# Patient Record
Sex: Female | Born: 1965 | Race: Black or African American | Hispanic: No | Marital: Single
Health system: Southern US, Community
[De-identification: ages and names within clinical notes are randomized; demographics above are authoritative.]

---

## 2005-07-21 ENCOUNTER — Ambulatory Visit: Payer: Self-pay | Admitting: Family Medicine

## 2005-09-08 ENCOUNTER — Ambulatory Visit: Payer: Self-pay | Admitting: Urology

## 2007-12-16 ENCOUNTER — Ambulatory Visit: Payer: Self-pay | Admitting: Family Medicine

## 2007-12-25 ENCOUNTER — Ambulatory Visit: Payer: Self-pay | Admitting: General Surgery

## 2007-12-25 ENCOUNTER — Other Ambulatory Visit: Payer: Self-pay

## 2008-01-16 ENCOUNTER — Ambulatory Visit: Payer: Self-pay | Admitting: General Surgery

## 2008-01-24 ENCOUNTER — Inpatient Hospital Stay: Payer: Self-pay | Admitting: Vascular Surgery

## 2008-08-19 ENCOUNTER — Ambulatory Visit: Payer: Self-pay | Admitting: Family Medicine

## 2008-09-03 ENCOUNTER — Ambulatory Visit: Payer: Self-pay | Admitting: Family Medicine

## 2009-04-27 ENCOUNTER — Ambulatory Visit: Payer: Self-pay | Admitting: Family Medicine

## 2010-01-12 ENCOUNTER — Ambulatory Visit: Payer: Self-pay | Admitting: Family Medicine

## 2011-05-15 ENCOUNTER — Ambulatory Visit: Payer: Self-pay | Admitting: Family Medicine

## 2012-06-18 ENCOUNTER — Ambulatory Visit: Payer: Self-pay | Admitting: Family Medicine

## 2012-07-02 ENCOUNTER — Ambulatory Visit: Payer: Self-pay | Admitting: Family Medicine

## 2013-01-16 ENCOUNTER — Ambulatory Visit: Payer: Self-pay | Admitting: Family Medicine

## 2014-06-29 ENCOUNTER — Ambulatory Visit: Payer: Self-pay | Admitting: Family Medicine

## 2016-04-17 ENCOUNTER — Ambulatory Visit
Admission: EM | Admit: 2016-04-17 | Discharge: 2016-04-17 | Disposition: A | Discharge status: Home / Self Care | Payer: 59 | Source: Ambulatory Visit | Attending: Family Medicine | Admitting: Family Medicine

## 2016-04-17 ENCOUNTER — Ambulatory Visit
Admission: RE | Admit: 2016-04-17 | Discharge: 2016-04-17 | Disposition: A | Discharge status: Home / Self Care | Payer: 59 | Source: Ambulatory Visit | Attending: Family Medicine | Admitting: Family Medicine

## 2016-04-17 ENCOUNTER — Other Ambulatory Visit (HOSPITAL_COMMUNITY): Payer: Self-pay | Admitting: Family Medicine

## 2016-04-17 DIAGNOSIS — M25561 Pain in right knee: Secondary | ICD-10-CM | POA: Diagnosis present

## 2016-04-17 DIAGNOSIS — M818 Other osteoporosis without current pathological fracture: Secondary | ICD-10-CM | POA: Diagnosis not present

## 2016-04-17 DIAGNOSIS — R52 Pain, unspecified: Secondary | ICD-10-CM

## 2016-04-17 DIAGNOSIS — M8580 Other specified disorders of bone density and structure, unspecified site: Secondary | ICD-10-CM | POA: Insufficient documentation

## 2016-06-09 ENCOUNTER — Other Ambulatory Visit: Payer: Self-pay | Admitting: Family Medicine

## 2016-06-09 DIAGNOSIS — Z1231 Encounter for screening mammogram for malignant neoplasm of breast: Secondary | ICD-10-CM

## 2016-07-03 ENCOUNTER — Ambulatory Visit: Payer: 59

## 2016-07-20 ENCOUNTER — Ambulatory Visit
Admission: RE | Admit: 2016-07-20 | Discharge: 2016-07-20 | Disposition: A | Discharge status: Home / Self Care | Payer: 59 | Source: Ambulatory Visit | Attending: Family Medicine | Admitting: Family Medicine

## 2016-07-20 DIAGNOSIS — Z1231 Encounter for screening mammogram for malignant neoplasm of breast: Secondary | ICD-10-CM | POA: Diagnosis present

## 2017-03-19 ENCOUNTER — Ambulatory Visit
Admission: RE | Admit: 2017-03-19 | Discharge: 2017-03-19 | Disposition: A | Discharge status: Home / Self Care | Payer: 59 | Source: Ambulatory Visit | Attending: Family Medicine | Admitting: Family Medicine

## 2017-03-19 ENCOUNTER — Other Ambulatory Visit: Payer: Self-pay | Admitting: Family Medicine

## 2017-03-19 DIAGNOSIS — M67432 Ganglion, left wrist: Secondary | ICD-10-CM | POA: Insufficient documentation

## 2017-09-03 ENCOUNTER — Other Ambulatory Visit: Payer: Self-pay | Admitting: Family Medicine

## 2017-09-03 DIAGNOSIS — Z1231 Encounter for screening mammogram for malignant neoplasm of breast: Secondary | ICD-10-CM

## 2017-09-27 ENCOUNTER — Ambulatory Visit
Admission: RE | Admit: 2017-09-27 | Discharge: 2017-09-27 | Disposition: A | Discharge status: Home / Self Care | Payer: 59 | Source: Ambulatory Visit | Attending: Family Medicine | Admitting: Family Medicine

## 2017-09-27 DIAGNOSIS — Z1231 Encounter for screening mammogram for malignant neoplasm of breast: Secondary | ICD-10-CM | POA: Diagnosis present

## 2019-06-17 ENCOUNTER — Telehealth: Payer: Self-pay

## 2019-06-17 ENCOUNTER — Other Ambulatory Visit: Payer: Self-pay

## 2019-06-17 NOTE — Telephone Encounter (Signed)
Gastroenterology Pre-Procedure Review  Request Date: PENDING CALL BACK Requesting Physician: Dr. PENDING  PATIENT REVIEW QUESTIONS: The patient responded to the following health history questions as indicated:    1. Are you having any GI issues? no 2. Do you have a personal history of Polyps? no 3. Do you have a family history of Colon Cancer or Polyps? no 4. Diabetes Mellitus? no 5. Joint replacements in the past 12 months?no 6. Major health problems in the past 3 months?no 7. Any artificial heart valves, MVP, or defibrillator?no    MEDICATIONS & ALLERGIES:    Patient reports the following regarding taking any anticoagulation/antiplatelet therapy:   Plavix, Coumadin, Eliquis, Xarelto, Lovenox, Pradaxa, Brilinta, or Effient? no Aspirin? no  Patient confirms/reports the following medications:  No current outpatient medications on file.   No current facility-administered medications for this visit.     Patient confirms/reports the following allergies:  Not on File  No orders of the defined types were placed in this encounter.   AUTHORIZATION INFORMATION Primary Insurance: 1D#: Group #:  Secondary Insurance: 1D#: Group #:  SCHEDULE INFORMATION: Date: PENDING PT Call Back Time: Location:[facility]

## 2019-06-25 ENCOUNTER — Encounter: Payer: Self-pay | Admitting: *Deleted

## 2020-11-02 ENCOUNTER — Other Ambulatory Visit: Payer: Self-pay | Admitting: Family Medicine

## 2020-11-02 DIAGNOSIS — Z1231 Encounter for screening mammogram for malignant neoplasm of breast: Secondary | ICD-10-CM

## 2020-11-23 ENCOUNTER — Ambulatory Visit
Admission: RE | Admit: 2020-11-23 | Discharge: 2020-11-23 | Disposition: A | Discharge status: Home / Self Care | Payer: No Typology Code available for payment source | Source: Ambulatory Visit | Attending: Family Medicine | Admitting: Family Medicine

## 2020-11-23 ENCOUNTER — Other Ambulatory Visit: Payer: Self-pay

## 2020-11-23 DIAGNOSIS — Z1231 Encounter for screening mammogram for malignant neoplasm of breast: Secondary | ICD-10-CM | POA: Insufficient documentation

## 2022-01-10 ENCOUNTER — Other Ambulatory Visit: Payer: Self-pay | Admitting: Family Medicine

## 2022-01-10 DIAGNOSIS — Z1231 Encounter for screening mammogram for malignant neoplasm of breast: Secondary | ICD-10-CM

## 2022-03-07 ENCOUNTER — Ambulatory Visit
Admission: RE | Admit: 2022-03-07 | Discharge: 2022-03-07 | Disposition: A | Discharge status: Home / Self Care | Payer: No Typology Code available for payment source | Source: Ambulatory Visit | Attending: Family Medicine | Admitting: Family Medicine

## 2022-03-07 DIAGNOSIS — Z1231 Encounter for screening mammogram for malignant neoplasm of breast: Secondary | ICD-10-CM | POA: Diagnosis present

## 2022-10-14 IMAGING — MG MM DIGITAL SCREENING BILAT W/ TOMO AND CAD
6 of 10 series · 6 of 30 positions shown · non-contrast
Comparison: Previous exam(s).

CLINICAL DATA: Screening.

EXAM:
DIGITAL SCREENING BILATERAL MAMMOGRAM WITH TOMOSYNTHESIS AND CAD
TECHNIQUE: Bilateral screening digital craniocaudal and mediolateral oblique
mammograms were obtained. Bilateral screening digital breast
tomosynthesis was performed. The images were evaluated with
computer-aided detection.

[L MLO synth-2D]
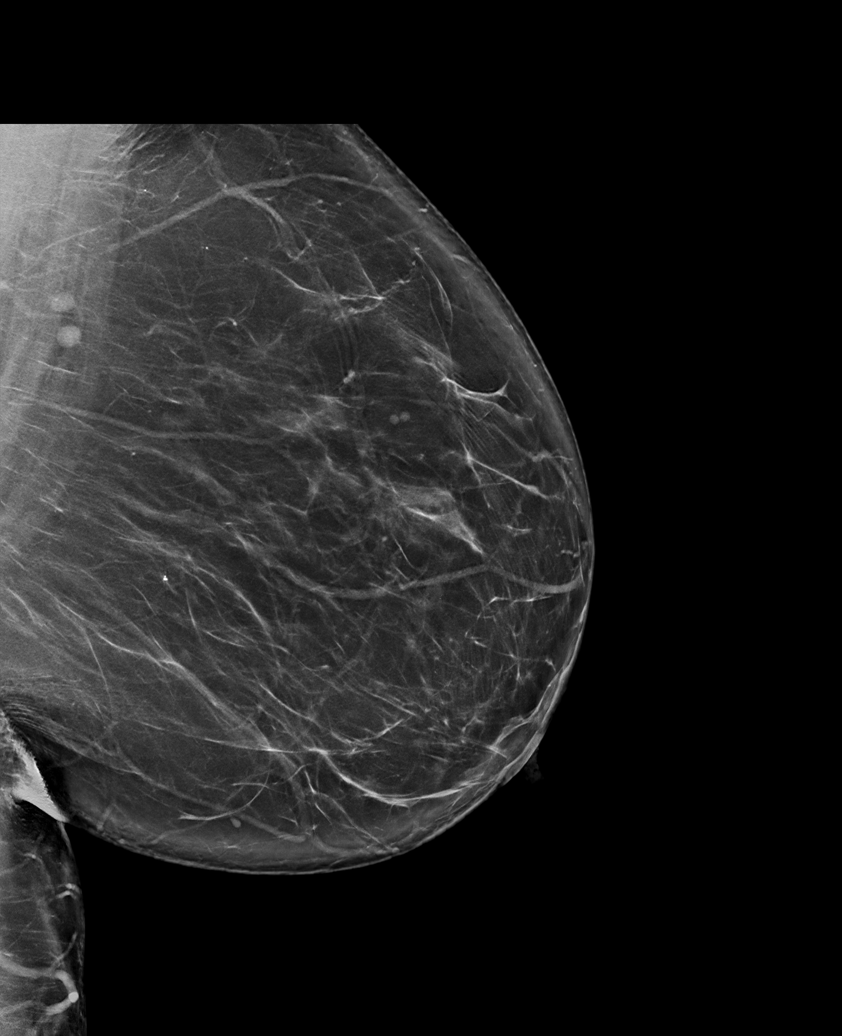

[R CC synth-2D]
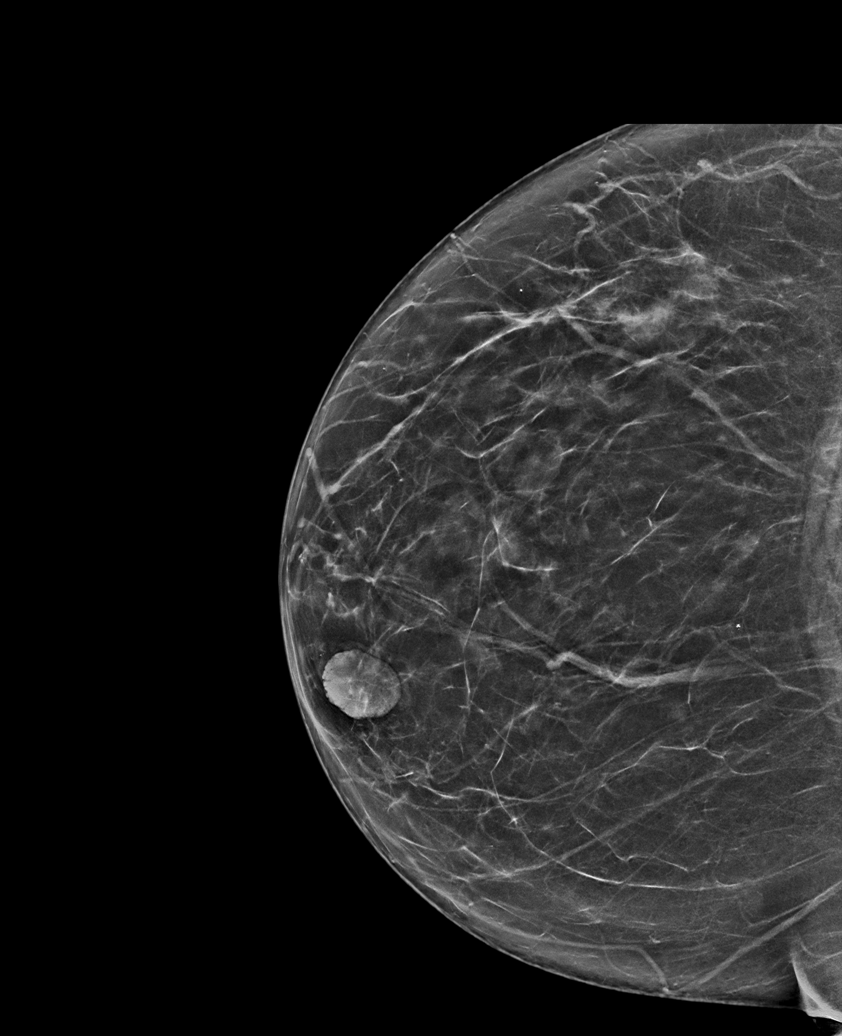

[L CC synth-2D]
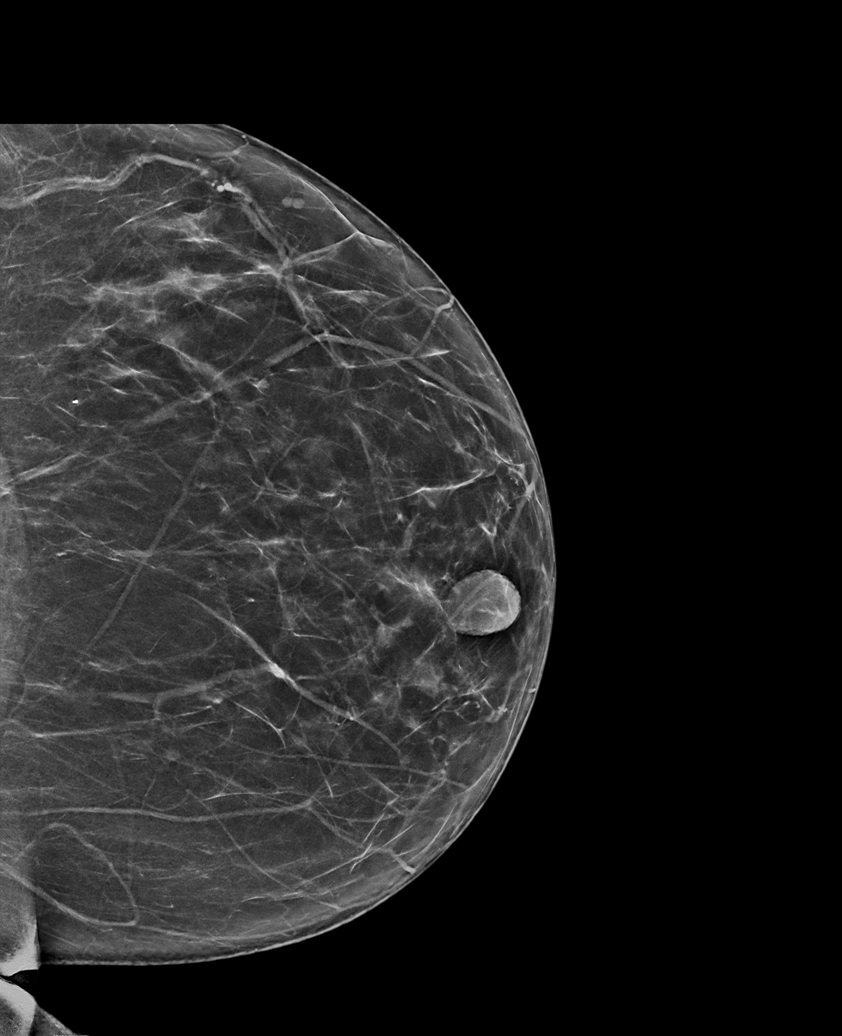

[R MLO synth-2D (1 of 2)]
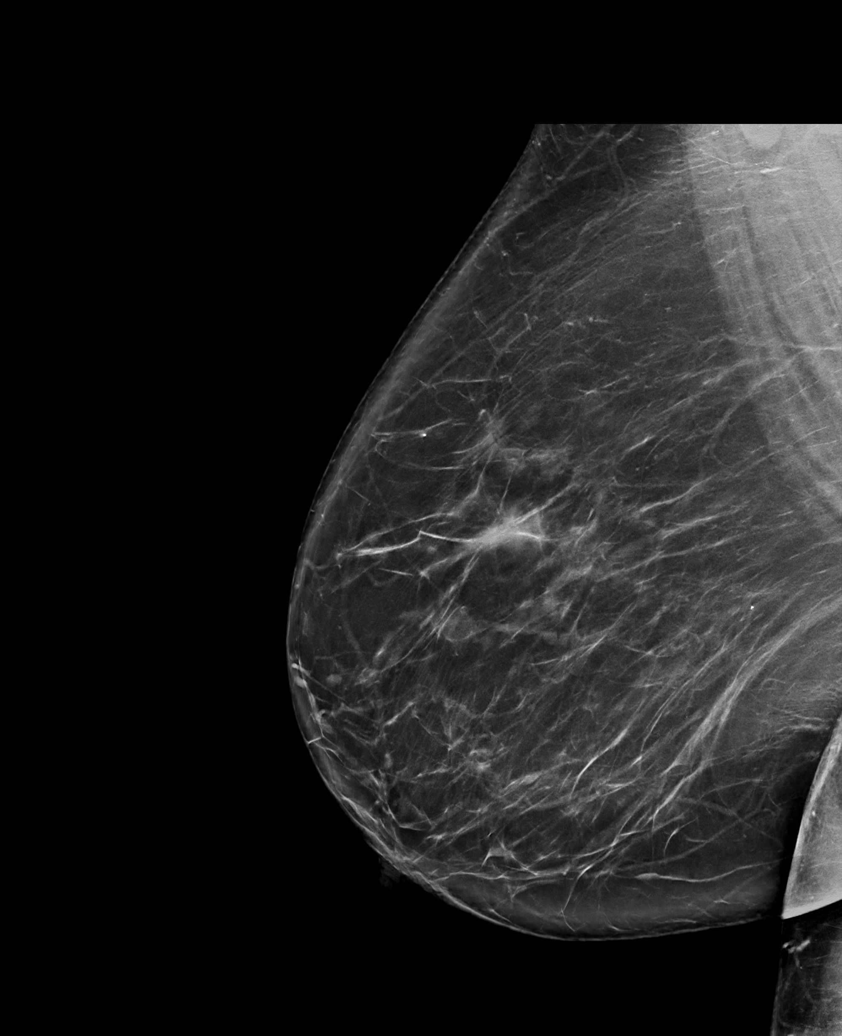

[R MLO synth-2D (2 of 2)]
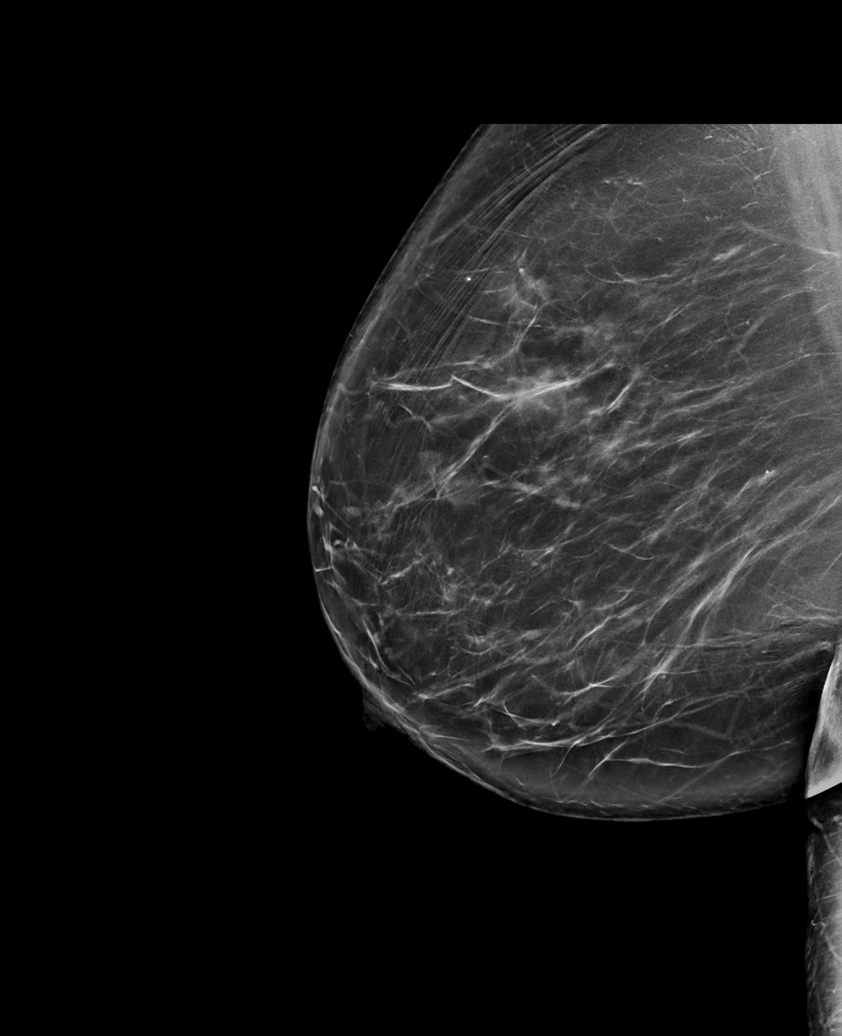

[R MLO tomo · tomo slice 48/95.0]
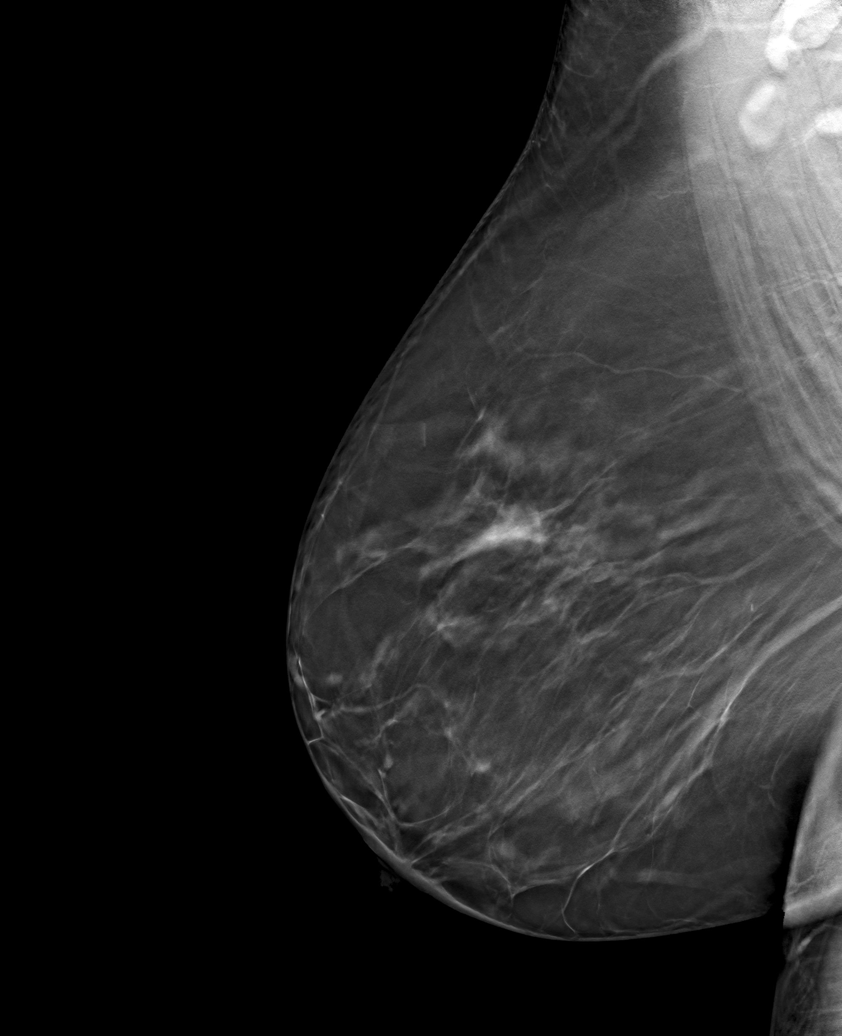

[6 of 30 positions shown; findings below may reference images not displayed]

ACR Breast Density Category b: There are scattered areas of
fibroglandular density.
FINDINGS: There are no findings suspicious for malignancy.
IMPRESSION: No mammographic evidence of malignancy. A result letter of this
screening mammogram will be mailed directly to the patient.

RECOMMENDATION:
Screening mammogram in one year. (Code:51-O-LD2)

BI-RADS CATEGORY  1: Negative.

## 2023-01-15 ENCOUNTER — Ambulatory Visit
Admission: RE | Admit: 2023-01-15 | Discharge: 2023-01-15 | Disposition: A | Discharge status: Home / Self Care | Payer: No Typology Code available for payment source | Source: Ambulatory Visit | Attending: Family Medicine | Admitting: Family Medicine

## 2023-01-15 ENCOUNTER — Other Ambulatory Visit: Payer: Self-pay | Admitting: Family Medicine

## 2023-01-15 DIAGNOSIS — M545 Low back pain, unspecified: Secondary | ICD-10-CM

## 2023-01-15 DIAGNOSIS — M533 Sacrococcygeal disorders, not elsewhere classified: Secondary | ICD-10-CM | POA: Insufficient documentation

## 2023-11-23 ENCOUNTER — Other Ambulatory Visit: Payer: Self-pay | Admitting: Family Medicine

## 2023-11-23 DIAGNOSIS — Z1231 Encounter for screening mammogram for malignant neoplasm of breast: Secondary | ICD-10-CM

## 2023-12-03 ENCOUNTER — Ambulatory Visit
Admission: RE | Admit: 2023-12-03 | Discharge: 2023-12-03 | Disposition: A | Discharge status: Home / Self Care | Source: Ambulatory Visit | Attending: Family Medicine | Admitting: Family Medicine

## 2023-12-03 DIAGNOSIS — Z1231 Encounter for screening mammogram for malignant neoplasm of breast: Secondary | ICD-10-CM | POA: Insufficient documentation
